# Patient Record
Sex: Female | Born: 2000 | Race: White | Hispanic: No | Marital: Single | Smoking: Never smoker
Health system: Southern US, Community
[De-identification: ages and names within clinical notes are randomized; demographics above are authoritative.]

---
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
## 2022-08-19 ENCOUNTER — Encounter: Payer: Self-pay | Admitting: *Deleted
